# Patient Record
Sex: Female | Born: 2005 | Race: Black or African American | Hispanic: No | Marital: Single | Smoking: Never smoker
Health system: Southern US, Community
[De-identification: ages and names within clinical notes are randomized; demographics above are authoritative.]

## PROBLEM LIST (undated history)

## (undated) DIAGNOSIS — J45909 Unspecified asthma, uncomplicated: Secondary | ICD-10-CM

## (undated) DIAGNOSIS — M419 Scoliosis, unspecified: Secondary | ICD-10-CM

---
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
## 2023-05-07 ENCOUNTER — Ambulatory Visit
Admission: EM | Admit: 2023-05-07 | Discharge: 2023-05-07 | Disposition: A | Discharge status: Home / Self Care | Payer: Medicaid Other | Attending: Internal Medicine | Admitting: Internal Medicine

## 2023-05-07 ENCOUNTER — Other Ambulatory Visit: Payer: Self-pay

## 2023-05-07 ENCOUNTER — Encounter: Payer: Self-pay | Admitting: *Deleted

## 2023-05-07 DIAGNOSIS — M546 Pain in thoracic spine: Secondary | ICD-10-CM

## 2023-05-07 DIAGNOSIS — M25511 Pain in right shoulder: Secondary | ICD-10-CM

## 2023-05-07 DIAGNOSIS — M25512 Pain in left shoulder: Secondary | ICD-10-CM

## 2023-05-07 DIAGNOSIS — M542 Cervicalgia: Secondary | ICD-10-CM | POA: Diagnosis not present

## 2023-05-07 MED ORDER — CYCLOBENZAPRINE HCL 5 MG PO TABS: 5.0000 mg | ORAL_TABLET | Freq: Two times a day (BID) | ORAL | 0 refills | Status: DC | PRN

## 2023-05-07 NOTE — Discharge Instructions (Signed)
I have prescribed you a muscle relaxer to take as needed for back and neck pain.  Please be advised that muscle relaxer can make you drowsy so do not drive or drink alcohol with it.
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
## 2023-09-11 ENCOUNTER — Encounter (HOSPITAL_COMMUNITY): Payer: Self-pay

## 2023-09-11 ENCOUNTER — Ambulatory Visit (HOSPITAL_COMMUNITY)
Admission: EM | Admit: 2023-09-11 | Discharge: 2023-09-11 | Disposition: A | Discharge status: Home / Self Care | Payer: No Typology Code available for payment source | Attending: Emergency Medicine | Admitting: Emergency Medicine

## 2023-09-11 DIAGNOSIS — J4521 Mild intermittent asthma with (acute) exacerbation: Secondary | ICD-10-CM | POA: Diagnosis not present

## 2023-09-11 DIAGNOSIS — J069 Acute upper respiratory infection, unspecified: Secondary | ICD-10-CM

## 2023-09-11 HISTORY — DX: Unspecified asthma, uncomplicated: J45.909

## 2023-09-11 MED ORDER — IPRATROPIUM-ALBUTEROL 0.5-2.5 (3) MG/3ML IN SOLN
3.0000 mL | Freq: Once | RESPIRATORY_TRACT | Status: AC
  Administered 2023-09-11: 3 mL via RESPIRATORY_TRACT

## 2023-09-11 MED ORDER — AMOXICILLIN-POT CLAVULANATE 875-125 MG PO TABS: 1.0000 | ORAL_TABLET | Freq: Two times a day (BID) | ORAL | 0 refills | Status: DC

## 2023-09-11 MED ORDER — IPRATROPIUM-ALBUTEROL 0.5-2.5 (3) MG/3ML IN SOLN
RESPIRATORY_TRACT | Status: AC
  Filled 2023-09-11: qty 3

## 2023-09-11 MED ORDER — BENZONATATE 100 MG PO CAPS: 100.0000 mg | ORAL_CAPSULE | Freq: Three times a day (TID) | ORAL | 0 refills | Status: DC

## 2023-09-11 MED ORDER — PREDNISONE 20 MG PO TABS: 40.0000 mg | ORAL_TABLET | Freq: Every day | ORAL | 0 refills | Status: AC

## 2023-09-11 MED ORDER — ALBUTEROL SULFATE HFA 108 (90 BASE) MCG/ACT IN AERS: 1.0000 | INHALATION_SPRAY | Freq: Four times a day (QID) | RESPIRATORY_TRACT | 0 refills | Status: AC | PRN

## 2023-09-11 NOTE — ED Provider Notes (Signed)
[facility]    CSN: [id number] Arrival date & time: 09/11/23  1608      History   Chief Complaint Chief Complaint  Patient presents with   Asthma   Cough    HPI Bailey Morgan is a 18 y.o. female.   Patient presents to clinic complaining of shortness of breath and wheezing with an asthma exacerbation.  She has been using her inhaler more frequently than she should and has not been helping.  Reports her symptoms have been ongoing for the past 2 weeks but when she woke up this morning she was having a hard time catching her breath.  Cough is productive with brown sputum.  Has been using her albuterol inhaler, NyQuil, over-the-counter cold and flu, and Benadryl without any improvement.  She did go to [place] at the end of January.  The history is provided by the patient and medical records.  Asthma  Cough   Past Medical History:  Diagnosis Date   Asthma    Scoliosis     There are no active problems to display for this patient.   History reviewed. No pertinent surgical history.  OB History   No obstetric history on file.      Home Medications    Prior to Admission medications   Medication Sig Start Date End Date Taking? Authorizing Provider  albuterol (VENTOLIN HFA) 108 (90 Base) MCG/ACT inhaler Inhale 1-2 puffs into the lungs every 6 (six) hours as needed for wheezing or shortness of breath. 03/22/22  Yes Raspet, Erin K, PA-C  albuterol (VENTOLIN HFA) 108 (90 Base) MCG/ACT inhaler Inhale 1-2 puffs into the lungs every 6 (six) hours as needed for wheezing or shortness of breath. 09/11/23  Yes Rinaldo Ratel, [place] N, FNP  amoxicillin-clavulanate (AUGMENTIN) 875-125 MG tablet Take 1 tablet by mouth every 12 (twelve) hours. 09/11/23  Yes Rinaldo Ratel, [place] N, FNP  benzonatate (TESSALON) 100 MG capsule Take 1 capsule (100 mg total) by mouth every 8 (eight) hours. 09/11/23  Yes Rinaldo Ratel, [place] N, FNP  cetirizine (ZYRTEC ALLERGY) 10 MG tablet Take 1 tablet (10 mg  total) by mouth daily. 03/22/22 09/11/23 Yes Raspet, Denny Peon K, PA-C  predniSONE (DELTASONE) 20 MG tablet Take 2 tablets (40 mg total) by mouth daily for 5 days. 09/11/23 09/16/23 Yes Rinaldo Ratel, [place] N, FNP  promethazine-dextromethorphan (PROMETHAZINE-DM) 6.25-15 MG/5ML syrup Take 5 mLs by mouth 4 (four) times daily as needed for cough. 03/22/22   Raspet, Noberto Retort, PA-C    Family History Family History  Problem Relation Age of Onset   Healthy Mother     Social History Social History   Tobacco Use   Smoking status: Every Day    Types: Cigars   Smokeless tobacco: Never  Vaping Use   Vaping status: Every Day   Substances: Nicotine, Flavoring  Substance Use Topics   Alcohol use: No   Drug use: No     Allergies   Mango flavor [flavoring agent]   Review of Systems Review of Systems  Per HPI   Physical Exam Triage Vital Signs ED Triage Vitals  Encounter Vitals Group     BP 09/11/23 1804 109/70     Systolic BP Percentile --      Diastolic BP Percentile --      Pulse Rate 09/11/23 1804 80     Resp 09/11/23 1804 18     Temp 09/11/23 1804 98.6 F (37 C)     Temp Source 09/11/23 1804 Oral     SpO2 09/11/23  1804 99 %     Weight --      Height --      Head Circumference --      Peak Flow --      Pain Score 09/11/23 1801 6     Pain Loc --      Pain Education --      Exclude from Growth Chart --    No data found.  Updated Vital Signs BP 109/70 (BP Location: Left Arm)   Pulse 80   Temp 98.6 F (37 C) (Oral)   Resp 18   LMP 08/11/2023 (Approximate)   SpO2 99%   Visual Acuity Right Eye Distance:   Left Eye Distance:   Bilateral Distance:    Right Eye Near:   Left Eye Near:    Bilateral Near:     Physical Exam Vitals and nursing note reviewed.  Constitutional:      Appearance: Normal appearance.  HENT:     Head: Normocephalic and atraumatic.     Right Ear: External ear normal.     Left Ear: External ear normal.     Nose: Nose normal.     Mouth/Throat:      Mouth: Mucous membranes are moist.  Eyes:     Conjunctiva/sclera: Conjunctivae normal.  Cardiovascular:     Rate and Rhythm: Normal rate and regular rhythm.  Pulmonary:     Effort: Pulmonary effort is normal.     Breath sounds: Examination of the right-lower field reveals decreased breath sounds. Examination of the left-lower field reveals decreased breath sounds. Decreased breath sounds present.  Musculoskeletal:        General: Normal range of motion.  Skin:    General: Skin is warm and dry.  Neurological:     General: No focal deficit present.     Mental Status: She is alert and oriented to person, place, and time.  Psychiatric:        Mood and Affect: Mood normal.        Behavior: Behavior normal. Behavior is cooperative.      UC Treatments / Results  Labs (all labs ordered are listed, but only abnormal results are displayed) Labs Reviewed - No data to display  EKG   Radiology No results found.  Procedures Procedures (including critical care time)  Medications Ordered in UC Medications  ipratropium-albuterol (DUONEB) 0.5-2.5 (3) MG/3ML nebulizer solution 3 mL (3 mLs Nebulization Given 09/11/23 1825)    Initial Impression / Assessment and Plan / UC Course  I have reviewed the triage vital signs and the nursing notes.  Pertinent labs & imaging results that were available during my care of the patient were reviewed by me and considered in my medical decision making (see chart for details).   Vitals and triage reviewed, patient is hemodynamically stable.  Heart with regular rate and rhythm, lungs somewhat diminished in lower lobes.  Oxygenation stable and able to speak in full sentences.  Increased albuterol inhaler use with wheezing and shortness of breath, requested DuoNeb in clinic and had subjective improvement afterwards.  Will treat with prednisone burst and refill albuterol inhaler for acute asthma exacerbation.  Symptoms have been ongoing for the past few weeks  with a productive cough, will cover with Augmentin for bacterial respiratory infection.  Symptomatic management, plan of care and follow-up care given, no questions at this time.  School note provided.    Final Clinical Impressions(s) / UC Diagnoses   Final diagnoses:  Upper respiratory tract infection, unspecified  type  Mild intermittent asthma with acute exacerbation     Discharge Instructions      You are having an exacerbation of your asthma, start the prednisone tomorrow with breakfast.  Take the antibiotics twice daily with food.  You can use the cough medicine every 8 hours as needed.  Drink at least 64 ounces of water daily, sleeping with a humidifier and taking 1200 mg of Mucinex can help loosen up any secretions.  Continue to use your albuterol inhaler every 4-6 hours as needed for wheezing or shortness of breath.  Symptoms should improve over the next 5 days or so.  If no improvement or any changes follow-up with your primary care provider or return to clinic.      ED Prescriptions     Medication Sig Dispense Auth. Provider   predniSONE (DELTASONE) 20 MG tablet Take 2 tablets (40 mg total) by mouth daily for 5 days. 10 tablet Rinaldo Ratel, [place] N, FNP   albuterol (VENTOLIN HFA) 108 (90 Base) MCG/ACT inhaler Inhale 1-2 puffs into the lungs every 6 (six) hours as needed for wheezing or shortness of breath. 18 g Shaquinta Peruski, [place] N, FNP   amoxicillin-clavulanate (AUGMENTIN) 875-125 MG tablet Take 1 tablet by mouth every 12 (twelve) hours. 14 tablet Rinaldo Ratel, [place] N, [place]   benzonatate (TESSALON) 100 MG capsule Take 1 capsule (100 mg total) by mouth every 8 (eight) hours. 21 capsule Keyetta Hollingworth, [place] N, [place]      PDMP not reviewed this encounter.   Chrishaun Sasso, [place] N, [place] 09/11/23 [phone]

## 2023-09-11 NOTE — ED Triage Notes (Signed)
Chief Complaint: Patient states her asthma has been flared up. Having SOB and productive cough. During week 2 the sputum had blood in it.   Sick exposure: Yes- her niece and her friend who is also being seen now.   Onset: 3 weeks  Prescriptions or OTC medications tried: Yes- ProAir inhaler, Nyquil, Cold and Flu sinus meds, benadryl    with no relief  New foods, medications, or products: No  Recent Travel: Yes- [place], [place] 08/22/23 - 08/27/23.

## 2023-09-11 NOTE — Discharge Instructions (Addendum)
You are having an exacerbation of your asthma, start the prednisone tomorrow with breakfast.  Take the antibiotics twice daily with food.  You can use the cough medicine every 8 hours as needed.  Drink at least 64 ounces of water daily, sleeping with a humidifier and taking 1200 mg of Mucinex can help loosen up any secretions.  Continue to use your albuterol inhaler every 4-6 hours as needed for wheezing or shortness of breath.  Symptoms should improve over the next 5 days or so.  If no improvement or any changes follow-up with your primary care provider or return to clinic.

## 2024-01-17 ENCOUNTER — Other Ambulatory Visit: Payer: Self-pay

## 2024-01-17 ENCOUNTER — Emergency Department (HOSPITAL_COMMUNITY)
Admission: EM | Admit: 2024-01-17 | Discharge: 2024-01-18 | Discharge status: Against Medical Advice | Attending: Emergency Medicine | Admitting: Emergency Medicine

## 2024-01-17 ENCOUNTER — Encounter (HOSPITAL_COMMUNITY): Payer: Self-pay

## 2024-01-17 DIAGNOSIS — Z5321 Procedure and treatment not carried out due to patient leaving prior to being seen by health care provider: Secondary | ICD-10-CM | POA: Insufficient documentation

## 2024-01-17 DIAGNOSIS — W269XXA Contact with unspecified sharp object(s), initial encounter: Secondary | ICD-10-CM | POA: Diagnosis not present

## 2024-01-17 DIAGNOSIS — S91319A Laceration without foreign body, unspecified foot, initial encounter: Secondary | ICD-10-CM | POA: Diagnosis present

## 2024-01-17 MED ORDER — IBUPROFEN 400 MG PO TABS
400.0000 mg | ORAL_TABLET | Freq: Once | ORAL | Status: AC | PRN
  Administered 2024-01-17: 400 mg via ORAL
  Filled 2024-01-17: qty 1

## 2024-01-17 NOTE — ED Triage Notes (Signed)
 [place]. It is 1.5cm in length, well approximated.

## 2024-01-18 ENCOUNTER — Encounter (HOSPITAL_COMMUNITY): Payer: Self-pay | Admitting: *Deleted

## 2024-01-18 ENCOUNTER — Ambulatory Visit (HOSPITAL_COMMUNITY)
Admission: EM | Admit: 2024-01-18 | Discharge: 2024-01-18 | Disposition: A | Discharge status: Home / Self Care | Attending: Emergency Medicine | Admitting: Emergency Medicine

## 2024-01-18 DIAGNOSIS — T148XXA Other injury of unspecified body region, initial encounter: Secondary | ICD-10-CM

## 2024-01-18 DIAGNOSIS — S91301A Unspecified open wound, right foot, initial encounter: Secondary | ICD-10-CM | POA: Diagnosis not present

## 2024-01-18 DIAGNOSIS — Z23 Encounter for immunization: Secondary | ICD-10-CM | POA: Diagnosis not present

## 2024-01-18 DIAGNOSIS — L089 Local infection of the skin and subcutaneous tissue, unspecified: Secondary | ICD-10-CM | POA: Diagnosis not present

## 2024-01-18 MED ORDER — DOXYCYCLINE HYCLATE 100 MG PO CAPS: 100.0000 mg | ORAL_CAPSULE | Freq: Two times a day (BID) | ORAL | 0 refills | Status: AC

## 2024-01-18 MED ORDER — TETANUS-DIPHTH-ACELL PERTUSSIS 5-2.5-18.5 LF-MCG/0.5 IM SUSY
0.5000 mL | PREFILLED_SYRINGE | Freq: Once | INTRAMUSCULAR | Status: AC
  Administered 2024-01-18: 0.5 mL via INTRAMUSCULAR

## 2024-01-18 MED ORDER — TETANUS-DIPHTH-ACELL PERTUSSIS 5-2.5-18.5 LF-MCG/0.5 IM SUSY
PREFILLED_SYRINGE | INTRAMUSCULAR | Status: AC
Start: 2024-01-18 — End: 2024-01-18
  Filled 2024-01-18: qty 0.5
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
## 2024-01-18 NOTE — Discharge Instructions (Addendum)
 We have updated your tetanus vaccine today. I am treating you for wound infection.  Please take doxycyline as prescribed. Take with food to avoid upset stomach. Finish the full course!  You can wash normally with mild soap and water (no alcohol or peroxide) I recommend antibiotic ointment twice daily Change dressing at least once daily  Please return if needed

## 2024-01-18 NOTE — ED Triage Notes (Signed)
 Pt states she [place] her right [place]. She has been using alcohol, some barber ointment on area. Last TDAP unknown.

## 2024-01-18 NOTE — ED Notes (Signed)
 Pt left ED stated she will be back in the morning. Pt advised to seek medical attention if her symptoms worsen or call 911 for any emergencies. Pt and family seen leaving the ED.

## 2024-01-19 ENCOUNTER — Emergency Department (HOSPITAL_COMMUNITY)
Admission: EM | Admit: 2024-01-19 | Discharge: 2024-01-19 | Disposition: A | Discharge status: Home / Self Care | Attending: Emergency Medicine | Admitting: Emergency Medicine

## 2024-01-19 ENCOUNTER — Emergency Department (HOSPITAL_COMMUNITY)

## 2024-01-19 ENCOUNTER — Encounter (HOSPITAL_COMMUNITY): Payer: Self-pay

## 2024-01-19 ENCOUNTER — Other Ambulatory Visit: Payer: Self-pay

## 2024-01-19 DIAGNOSIS — R079 Chest pain, unspecified: Secondary | ICD-10-CM | POA: Insufficient documentation

## 2024-01-19 DIAGNOSIS — J45909 Unspecified asthma, uncomplicated: Secondary | ICD-10-CM | POA: Insufficient documentation

## 2024-01-19 LAB — TROPONIN I (HIGH SENSITIVITY)
Troponin I (High Sensitivity): 3 ng/L (ref <18)
Troponin I (High Sensitivity): 2 ng/L (ref <18)

## 2024-01-19 LAB — BASIC METABOLIC PANEL WITH GFR
Anion gap: 8 (ref 5–15)
BUN: 12 mg/dL (ref 6–20)
CO2: 22 mmol/L (ref 22–32)
Calcium: 8.9 mg/dL (ref 8.9–10.3)
Chloride: 105 mmol/L (ref 98–111)
Creatinine, Ser: 0.63 mg/dL (ref 0.44–1.00)
GFR, Estimated: >60 mL/min (ref >60)
Glucose, Bld: 87 mg/dL (ref 70–99)
Potassium: 3.6 mmol/L (ref 3.5–5.1)
Sodium: 135 mmol/L (ref 135–145)

## 2024-01-19 LAB — CBC
HCT: 34.3 % — ABNORMAL LOW (ref 36.0–46.0)
Hemoglobin: 10.6 g/dL — ABNORMAL LOW (ref 12.0–15.0)
MCH: 28 pg (ref 26.0–34.0)
MCHC: 30.9 g/dL (ref 30.0–36.0)
MCV: 90.5 fL (ref 80.0–100.0)
Platelets: 208 10*3/uL (ref 150–400)
RBC: 3.79 MIL/uL — ABNORMAL LOW (ref 3.87–5.11)
RDW: 15.2 % (ref 11.5–15.5)
WBC: 5.3 10*3/uL (ref 4.0–10.5)
nRBC: 0 % (ref 0.0–0.2)

## 2024-01-19 LAB — HCG, SERUM, QUALITATIVE: Preg, Serum: NEGATIVE

## 2024-01-19 MED ORDER — ALUM & MAG HYDROXIDE-SIMETH 200-200-20 MG/5ML PO SUSP
30.0000 mL | Freq: Once | ORAL | Status: AC
  Administered 2024-01-19: 30 mL via ORAL
  Filled 2024-01-19: qty 30

## 2024-01-19 MED ORDER — ACETAMINOPHEN 325 MG PO TABS
650.0000 mg | ORAL_TABLET | Freq: Once | ORAL | Status: AC
  Administered 2024-01-19: 650 mg via ORAL
  Filled 2024-01-19: qty 2

## 2024-01-19 MED ORDER — FAMOTIDINE 20 MG PO TABS
20.0000 mg | ORAL_TABLET | Freq: Once | ORAL | Status: AC
  Administered 2024-01-19: 20 mg via ORAL
  Filled 2024-01-19: qty 1

## 2024-01-19 MED ORDER — FAMOTIDINE 20 MG PO TABS: 20.0000 mg | ORAL_TABLET | Freq: Every day | ORAL | 0 refills | Status: AC

## 2024-01-19 NOTE — ED Provider Notes (Signed)
 [place] EMERGENCY DEPARTMENT AT [facility] Provider Note   CSN: [id number] Arrival date & time: 01/19/24  9486     Patient presents with: Chest Pain   Bailey Morgan is a 18 y.o. female.   Patient is a 18 year old female with past medical history of asthma presenting to the emergency department with chest pain.  The patient states that she woke up early this morning with chest pain.  She states it feels like a sharp type of pain in her mid chest that radiates into her back.  She states that she felt short of breath when she woke up but the shortness of breath is since resolved.  Denies any associated nausea or vomiting.  Denies any recent fever or cough.  Denies any lower extremity swelling though does report that she cut her foot 2 days ago and was recently seen at urgent care.  Denies any history of VTE, any recent hospitalization or surgery, any recent long travel car plane, hormone use or cancer history.  Denies any family history of early cardiac disease.  She denies any tobacco use.  The history is provided by the patient.  Chest Pain      Prior to Admission medications   Medication Sig Start Date End Date Taking? Authorizing Provider  famotidine (PEPCID) 20 MG tablet Take 1 tablet (20 mg total) by mouth daily. 01/19/24  Yes Ellouise, Christophor Eick K, DO  albuterol  (VENTOLIN  HFA) 108 [phone] Base) MCG/ACT inhaler Inhale 1-2 puffs into the lungs every 6 (six) hours as needed for wheezing or shortness of breath. 03/22/22   Raspet, Rocky K, PA-C  albuterol  (VENTOLIN  HFA) 108 (90 Base) MCG/ACT inhaler Inhale 1-2 puffs into the lungs every 6 (six) hours as needed for wheezing or shortness of breath. 09/11/23   Dreama, [place]  N, FNP  doxycycline (VIBRAMYCIN) 100 MG capsule Take 1 capsule (100 mg total) by mouth 2 (two) times daily for 7 days. 01/18/24 01/25/24  Rising, Asberry, PA-C    Allergies: Mango flavor [flavoring agent]    Review of Systems  Cardiovascular:  Positive for  chest pain.    Updated Vital Signs BP 114/74   Pulse 61   Temp 97.9 F (36.6 C) (Oral)   Resp 17   Ht 5' 5 (1.651 m)   Wt 59 kg   LMP 01/05/2024 (Exact Date)   SpO2 100%   BMI 21.63 kg/m   Physical Exam Vitals and nursing note reviewed.  Constitutional:      General: She is not in acute distress.    Appearance: She is well-developed.  HENT:     Head: Normocephalic.   Eyes:     Extraocular Movements: Extraocular movements intact.    Cardiovascular:     Rate and Rhythm: Normal rate and regular rhythm.     Pulses:          Radial pulses are 2+ on the right side and 2+ on the left side.     Heart sounds: Normal heart sounds.  Pulmonary:     Effort: Pulmonary effort is normal.     Breath sounds: Normal breath sounds.  Chest:     Chest wall: No tenderness.  Abdominal:     Palpations: Abdomen is soft.     Tenderness: There is no abdominal tenderness.   Musculoskeletal:        General: Normal range of motion.     Cervical back: Normal range of motion and neck supple.     Right lower leg: No  edema.     Left lower leg: No edema.   Skin:    General: Skin is warm and dry.   Neurological:     General: No focal deficit present.     Mental Status: She is alert and oriented to person, place, and time.   Psychiatric:        Mood and Affect: Mood normal.        Behavior: Behavior normal.     (all labs ordered are listed, but only abnormal results are displayed) Labs Reviewed  CBC - Abnormal; Notable for the following components:      Result Value   RBC 3.79 (*)    Hemoglobin 10.6 (*)    HCT 34.3 (*)    All other components within normal limits  BASIC METABOLIC PANEL WITH GFR  HCG, SERUM, QUALITATIVE  TROPONIN I (HIGH SENSITIVITY)  TROPONIN I (HIGH SENSITIVITY)    EKG: EKG Interpretation Date/Time:  Tuesday January 19 2024 05:32:38 EDT Ventricular Rate:  70 PR Interval:  130 QRS Duration:  74 QT Interval:  362 QTC Calculation: 390 R Axis:   81  Text  Interpretation: Normal sinus rhythm with sinus arrhythmia Nonspecific ST abnormality Abnormal ECG No previous ECGs available Confirmed by Towana Sharper [phone]) on 01/19/2024 7:51:09 AM  Radiology: DG Chest 2 View Result Date: 01/19/2024 CLINICAL DATA:  Chest pain. EXAM: CHEST - 2 VIEW COMPARISON:  07/06/2020 FINDINGS: Heart size and mediastinal contours appear normal. There is no pleural fluid, interstitial edema or airspace disease. Mild central airway thickening. Visualized osseous structures are unremarkable. IMPRESSION: Mild central airway thickening compatible with bronchitis or reactive airways disease. Electronically Signed   By: Waddell Calk M.D.   On: 01/19/2024 06:00     Procedures   Medications Ordered in the ED  famotidine (PEPCID) tablet 20 mg (20 mg Oral Given 01/19/24 0911)  alum & mag hydroxide-simeth (MAALOX/MYLANTA) 200-200-20 MG/5ML suspension 30 mL (30 mLs Oral Given 01/19/24 0911)  acetaminophen (TYLENOL) tablet 650 mg (650 mg Oral Given 01/19/24 0910)    Clinical Course as of 01/19/24 1034  Tue Jan 19, 2024  1022 Repeat troponin remains negative. Patient is stable for discharge home with outpatient follow up. Low risk HEART score 0. Patient reports pain resolved with GI cocktail. [VK]    Clinical Course User Index [VK] Kingsley, Christa Fasig K, DO                                 Medical Decision Making This patient presents to the ED with chief complaint(s) of chest pain with pertinent past medical history of asthma which further complicates the presenting complaint. The complaint involves an extensive differential diagnosis and also carries with it a high risk of complications and morbidity.    The differential diagnosis includes ACS, arrhythmia, anemia, pneumonia, pneumothorax, pulmonary edema, pleural effusion, she is PERC negative making PE unlikely, no wheezing on exam making asthma exacerbation unlikely, considering MSK pain, gastritis, GERD  Additional history  obtained: Additional history obtained from N/A Records reviewed N/A  ED Course and Reassessment: On patient's arrival she is hemodynamically stable in no acute distress.  EKG on arrival showed normal sinus rhythm without acute ischemic changes.  She had labs including troponin and chest x-ray performed in triage.  Initial troponin was negative.  Chest x-ray did show some signs of bronchitis though she has no symptoms of bronchitis and no wheezing on exam making this more  likely chronic finding related to her asthma.  Will need repeat troponin.  Will try a GI cocktail and Tylenol for pain and will be closely reassessed.  Independent labs interpretation:  The following labs were independently interpreted: within normal range  Independent visualization of imaging: - I independently visualized the following imaging with scope of interpretation limited to determining acute life threatening conditions related to emergency care: CXR, which revealed findings of reactive airway disease  Consultation: - Consulted or discussed management/test interpretation w/ external professional: N/A  Consideration for admission or further workup: Patient has no emergent conditions requiring admission or further work-up at this time and is stable for discharge home with primary care follow-up  Social Determinants of health: N/A    Amount and/or Complexity of Data Reviewed Labs: ordered. Radiology: ordered.  Risk OTC drugs.       Final diagnoses:  Nonspecific chest pain    ED Discharge Orders          Ordered    famotidine (PEPCID) 20 MG tablet  Daily        01/19/24 1032               Kingsley, [place] K, DO 01/19/24 1034

## 2024-01-19 NOTE — ED Triage Notes (Signed)
 Pt experiencing chest heaviness that woke her up out of her sleep this am. Having shortness of breath as well. He also reports having cold chills and then being extremely hot

## 2024-01-19 NOTE — Discharge Instructions (Addendum)
 You were seen in the emergency department for your chest pain.  Your workup showed no signs of heart attack or stress on your heart and I suspect that your pain was likely related to acid reflux.  Have given you an antacid that you should take daily for the next 1 to 2 weeks to see if this helps with your symptoms and you can take over-the-counter Maalox or Tums as needed.  You can follow-up with your primary doctor in the next few days to have your symptoms rechecked.  You should return to the emergency department if having significantly worsening pain, severe shortness of breath that is not go away, repetitive vomiting or any other new or concerning symptoms.
